# Patient Record
Sex: Male | Born: 2005 | Race: White | Hispanic: No | Marital: Single | Smoking: Never smoker
Health system: Southern US, Community
[De-identification: ages and names within clinical notes are randomized; demographics above are authoritative.]

## PROBLEM LIST (undated history)

## (undated) DIAGNOSIS — M92529 Juvenile osteochondrosis of tibia tubercle, unspecified leg: Secondary | ICD-10-CM

## (undated) DIAGNOSIS — J45909 Unspecified asthma, uncomplicated: Secondary | ICD-10-CM

## (undated) HISTORY — PX: EAR TUBE REMOVAL: SHX1486

## (undated) HISTORY — PX: EYE SURGERY: SHX253

## (undated) HISTORY — PX: OTHER SURGICAL HISTORY: SHX169

---
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
## 2024-01-05 DIAGNOSIS — M9901 Segmental and somatic dysfunction of cervical region: Secondary | ICD-10-CM | POA: Diagnosis not present

## 2024-01-05 DIAGNOSIS — M7541 Impingement syndrome of right shoulder: Secondary | ICD-10-CM | POA: Diagnosis not present

## 2024-01-05 DIAGNOSIS — M9907 Segmental and somatic dysfunction of upper extremity: Secondary | ICD-10-CM | POA: Diagnosis not present

## 2024-01-05 DIAGNOSIS — M9902 Segmental and somatic dysfunction of thoracic region: Secondary | ICD-10-CM | POA: Diagnosis not present

## 2024-03-05 DIAGNOSIS — M9902 Segmental and somatic dysfunction of thoracic region: Secondary | ICD-10-CM | POA: Diagnosis not present

## 2024-03-05 DIAGNOSIS — M9901 Segmental and somatic dysfunction of cervical region: Secondary | ICD-10-CM | POA: Diagnosis not present

## 2024-03-05 DIAGNOSIS — M9907 Segmental and somatic dysfunction of upper extremity: Secondary | ICD-10-CM | POA: Diagnosis not present

## 2024-03-05 DIAGNOSIS — M7541 Impingement syndrome of right shoulder: Secondary | ICD-10-CM | POA: Diagnosis not present

## 2024-03-29 DIAGNOSIS — M25521 Pain in right elbow: Secondary | ICD-10-CM | POA: Diagnosis not present

## 2024-04-05 DIAGNOSIS — M25521 Pain in right elbow: Secondary | ICD-10-CM | POA: Diagnosis not present

## 2024-04-07 DIAGNOSIS — M25521 Pain in right elbow: Secondary | ICD-10-CM | POA: Diagnosis not present

## 2024-04-10 DIAGNOSIS — M25521 Pain in right elbow: Secondary | ICD-10-CM | POA: Diagnosis not present

## 2024-04-17 DIAGNOSIS — M7541 Impingement syndrome of right shoulder: Secondary | ICD-10-CM | POA: Diagnosis not present

## 2024-04-17 DIAGNOSIS — M9902 Segmental and somatic dysfunction of thoracic region: Secondary | ICD-10-CM | POA: Diagnosis not present

## 2024-04-17 DIAGNOSIS — M9907 Segmental and somatic dysfunction of upper extremity: Secondary | ICD-10-CM | POA: Diagnosis not present

## 2024-04-17 DIAGNOSIS — M9901 Segmental and somatic dysfunction of cervical region: Secondary | ICD-10-CM | POA: Diagnosis not present

## 2024-05-03 DIAGNOSIS — M9907 Segmental and somatic dysfunction of upper extremity: Secondary | ICD-10-CM | POA: Diagnosis not present

## 2024-05-03 DIAGNOSIS — M7541 Impingement syndrome of right shoulder: Secondary | ICD-10-CM | POA: Diagnosis not present

## 2024-05-03 DIAGNOSIS — M9901 Segmental and somatic dysfunction of cervical region: Secondary | ICD-10-CM | POA: Diagnosis not present

## 2024-05-03 DIAGNOSIS — M9902 Segmental and somatic dysfunction of thoracic region: Secondary | ICD-10-CM | POA: Diagnosis not present

## 2024-05-10 DIAGNOSIS — M9907 Segmental and somatic dysfunction of upper extremity: Secondary | ICD-10-CM | POA: Diagnosis not present

## 2024-05-10 DIAGNOSIS — M9902 Segmental and somatic dysfunction of thoracic region: Secondary | ICD-10-CM | POA: Diagnosis not present

## 2024-05-10 DIAGNOSIS — M9901 Segmental and somatic dysfunction of cervical region: Secondary | ICD-10-CM | POA: Diagnosis not present

## 2024-05-10 DIAGNOSIS — M7541 Impingement syndrome of right shoulder: Secondary | ICD-10-CM | POA: Diagnosis not present

## 2024-08-16 ENCOUNTER — Encounter: Admitting: Family Medicine
